# Patient Record
Sex: Female | Born: 1985 | Race: White | Hispanic: No | Marital: Single | Smoking: Never smoker
Health system: Southern US, Community
[De-identification: ages and names within clinical notes are randomized; demographics above are authoritative.]

---

## 2011-09-18 ENCOUNTER — Other Ambulatory Visit (HOSPITAL_COMMUNITY)
Admission: RE | Admit: 2011-09-18 | Discharge: 2011-09-18 | Disposition: A | Discharge status: Home / Self Care | Payer: BC Managed Care – PPO | Source: Ambulatory Visit | Attending: Obstetrics and Gynecology | Admitting: Obstetrics and Gynecology

## 2011-09-18 DIAGNOSIS — Z01419 Encounter for gynecological examination (general) (routine) without abnormal findings: Secondary | ICD-10-CM | POA: Insufficient documentation

## 2012-02-29 ENCOUNTER — Other Ambulatory Visit: Payer: Self-pay | Admitting: Obstetrics

## 2018-08-30 ENCOUNTER — Other Ambulatory Visit (HOSPITAL_COMMUNITY)
Admission: RE | Admit: 2018-08-30 | Discharge: 2018-08-30 | Disposition: A | Discharge status: Home / Self Care | Payer: BC Managed Care – PPO | Source: Ambulatory Visit | Attending: Obstetrics and Gynecology | Admitting: Obstetrics and Gynecology

## 2018-08-30 ENCOUNTER — Other Ambulatory Visit: Payer: Self-pay | Admitting: Obstetrics and Gynecology

## 2018-08-30 DIAGNOSIS — Z01411 Encounter for gynecological examination (general) (routine) with abnormal findings: Secondary | ICD-10-CM | POA: Diagnosis not present

## 2018-09-02 LAB — CYTOLOGY - PAP
DIAGNOSIS: NEGATIVE
HPV: NOT DETECTED

## 2019-08-18 ENCOUNTER — Other Ambulatory Visit: Payer: Self-pay

## 2019-08-18 DIAGNOSIS — Z20822 Contact with and (suspected) exposure to covid-19: Secondary | ICD-10-CM

## 2019-08-19 LAB — NOVEL CORONAVIRUS, NAA: SARS-CoV-2, NAA: NOT DETECTED

## 2020-01-11 ENCOUNTER — Encounter: Payer: BC Managed Care – PPO | Admitting: Adult Health

## 2021-03-01 ENCOUNTER — Other Ambulatory Visit: Payer: Self-pay

## 2021-03-01 ENCOUNTER — Emergency Department (HOSPITAL_COMMUNITY)
Admission: EM | Admit: 2021-03-01 | Discharge: 2021-03-01 | Disposition: A | Discharge status: Home / Self Care | Payer: BC Managed Care – PPO | Attending: Emergency Medicine | Admitting: Emergency Medicine

## 2021-03-01 ENCOUNTER — Encounter (HOSPITAL_COMMUNITY): Payer: Self-pay

## 2021-03-01 ENCOUNTER — Emergency Department (HOSPITAL_COMMUNITY): Payer: BC Managed Care – PPO

## 2021-03-01 DIAGNOSIS — R42 Dizziness and giddiness: Secondary | ICD-10-CM | POA: Diagnosis not present

## 2021-03-01 DIAGNOSIS — R0602 Shortness of breath: Secondary | ICD-10-CM | POA: Diagnosis not present

## 2021-03-01 DIAGNOSIS — M79602 Pain in left arm: Secondary | ICD-10-CM | POA: Diagnosis not present

## 2021-03-01 LAB — TROPONIN I (HIGH SENSITIVITY): Troponin I (High Sensitivity): <2 ng/L (ref <18)

## 2021-03-01 LAB — BASIC METABOLIC PANEL
Anion gap: 10 (ref 5–15)
BUN: 13 mg/dL (ref 6–20)
CO2: 26 mmol/L (ref 22–32)
Calcium: 9 mg/dL (ref 8.9–10.3)
Chloride: 102 mmol/L (ref 98–111)
Creatinine, Ser: 0.79 mg/dL (ref 0.44–1.00)
GFR, Estimated: >60 mL/min (ref >60)
Glucose, Bld: 100 mg/dL — ABNORMAL HIGH (ref 70–99)
Potassium: 3.9 mmol/L (ref 3.5–5.1)
Sodium: 138 mmol/L (ref 135–145)

## 2021-03-01 LAB — I-STAT BETA HCG BLOOD, ED (MC, WL, AP ONLY): I-stat hCG, quantitative: <5 m[IU]/mL (ref <5)

## 2021-03-01 LAB — CBC
HCT: 43 % (ref 36.0–46.0)
Hemoglobin: 14.5 g/dL (ref 12.0–15.0)
MCH: 29.2 pg (ref 26.0–34.0)
MCHC: 33.7 g/dL (ref 30.0–36.0)
MCV: 86.7 fL (ref 80.0–100.0)
Platelets: 312 10*3/uL (ref 150–400)
RBC: 4.96 MIL/uL (ref 3.87–5.11)
RDW: 12.9 % (ref 11.5–15.5)
WBC: 9.3 10*3/uL (ref 4.0–10.5)
nRBC: 0 % (ref 0.0–0.2)

## 2021-03-01 NOTE — ED Triage Notes (Signed)
Patient reports L arm pain that woke her up out of her sleep, then sudden urge to need to vomit, states she was sitting in the bathroom and had cold sweats and felt like she couldn't move.

## 2021-03-01 NOTE — Discharge Instructions (Signed)
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  Alternate ibuprofen or acetaminophen, ice and rest as needed  Your cardiac work up was normal today.  I don't think you had a cardiac primary problem.    Bug spray poisoning can cause breathing symptoms as well as light headedness BUT this is usually from DEET.  The product you used contains neem oil which is not typically seen to cause poisoning.  It is possible that the odor may have affected you.  Also, sudden standing can cause drop in blood pressure that can lead to light headedness, sweats, nausea, passing out  Monitor your symptoms, return for chest pain, shortness of breath, cough, bloody phlegm, light headedness or passing out

## 2021-03-01 NOTE — ED Provider Notes (Signed)
[facility] EMERGENCY DEPARTMENT Provider Note   CSN: [id number] Arrival date & time: 03/01/21  0419     History Chief Complaint  Patient presents with  . Arm Pain  . Nausea    Michele Delgado is a 35 y.o. female presents to the ED for evaluation of sudden onset left-sided arm pain that woke her up from her sleep at 4 AM this morning.  Pain was throbbing and located on the elbow.  She had a little bit of tingling as well.  She quickly sat up and stood up at the side of her bed and developed lightheadedness, she broke out in a cold sweat, nausea.  She felt like she was going to pass out.  She sat down and felt a little bit better.  States her "lungs hurt" and felt like it was hard to breathe.  Symptoms continued so she drove herself to the emergency department.  States her symptoms have completely resolved 30 minutes prior to being roomed.  States that yesterday she did a lot of yard work with a machine that she had to use her left hand to stabilize and move it.  States that her left arm is still sore to the touch and thinks this is related to the yardwork she did yesterday.  Also states that she sprayed prove bedbug spray at 5:30 PM yesterday all over her house.  She returned to her house at around 11 PM and she still noticed a very strong odor.  She opened her windows and went to bed.  She thinks the odor may have caused her symptoms as well because she now feels "completely fine".  She is not particularly concerned about bedbugs but states her friend told her she had bedbugs when she came back from a vacation.  Patient just came back from a trip to [facility] yesterday and was worried about the bedbugs so that she sprayed her house.  She never had chest pain, vomiting, headache.  Her breathing feels fine now.  She has a carbon monoxide monitor that is working well.  No longer having tingling in the left arm, no numbness, neck pain.  She is right-hand dominant.  She did a lot of yard  work yesterday but states she drank a lot of water.  No recreational drug use or alcohol use.  HPI     No past medical history on file.  There are no problems to display for this patient.   **The histories are not reviewed yet. Please review them in the "History" navigator section and refresh this SmartLink.   OB History   No obstetric history on file.     No family history on file.  Social History   Tobacco Use  . Smoking status: Never Smoker  . Smokeless tobacco: Never Used  Substance Use Topics  . Alcohol use: Yes  . Drug use: Never    Home Medications Prior to Admission medications   Medication Sig Start Date End Date Taking? Authorizing Provider  fluconazole (DIFLUCAN) 150 MG tablet Take 150 mg by mouth daily as needed (yeast infection). 09/01/20  Yes [provider]  LORazepam (ATIVAN) 0.5 MG tablet Take 0.5 mg by mouth at bedtime as needed for sleep. 09/11/20  Yes [provider]  spironolactone (ALDACTONE) 50 MG tablet Take 50 mg by mouth daily. 12/19/20  Yes [provider]  TRI-ESTARYLLA 0.18/0.215/0.25 MG-35 MCG tablet Take 1 tablet by mouth daily. 02/27/21  Yes [provider]    Allergies  Kiwi extract  Review of Systems   Review of Systems  Respiratory: Positive for shortness of breath.   Gastrointestinal: Positive for nausea.  Musculoskeletal: Positive for myalgias (left arm).  Neurological: Positive for light-headedness.       Left arm tingling   All other systems reviewed and are negative.   Physical Exam Updated Vital Signs BP 107/82   Pulse 86   Temp 98 F (36.7 C) (Oral)   Resp 16   Ht 5\' 5"  (1.651 m)   Wt 71.7 kg   LMP 02/16/2021   SpO2 99%   BMI 26.29 kg/m   Physical Exam Constitutional:      Appearance: She is well-developed.     Comments: NAD. Non toxic.   HENT:     Head: Normocephalic and atraumatic.     Nose: Nose normal.  Eyes:     General: Lids are normal.     Conjunctiva/sclera:  Conjunctivae normal.  Neck:     Trachea: Trachea normal.     Comments: Trachea midline.  Cardiovascular:     Rate and Rhythm: Normal rate and regular rhythm.     Pulses:          Radial pulses are 1+ on the right side and 1+ on the left side.       Dorsalis pedis pulses are 1+ on the right side and 1+ on the left side.     Heart sounds: Normal heart sounds, S1 normal and S2 normal.     Comments: No murmurs. No LE edema or calf tenderness.  Pulmonary:     Effort: Pulmonary effort is normal.     Breath sounds: Normal breath sounds.  Abdominal:     General: Bowel sounds are normal.     Palpations: Abdomen is soft.     Tenderness: There is no abdominal tenderness.     Comments: No epigastric or upper abdominal tenderness.  Musculoskeletal:        General: Tenderness present.     Cervical back: Normal range of motion.     Comments: Left upper extremity: mild TTP around proximal forearm, AC fossa and distal bicep.  Skin normal. Compartments soft   Skin:    General: Skin is warm and dry.     Capillary Refill: Capillary refill takes less than 2 seconds.     Comments: No rash to chest wall  Neurological:     Mental Status: She is alert.     GCS: GCS eye subscore is 4. GCS verbal subscore is 5. GCS motor subscore is 6.     Comments: Sensation and strength intact in upper/lower extremities  Psychiatric:        Speech: Speech normal.        Behavior: Behavior normal.        Thought Content: Thought content normal.     ED Results / Procedures / Treatments   Labs (all labs ordered are listed, but only abnormal results are displayed) Labs Reviewed  BASIC METABOLIC PANEL - Abnormal; Notable for the following components:      Result Value   Glucose, Bld 100 (*)    All other components within normal limits  CBC  I-STAT BETA HCG BLOOD, ED ([facility], [facility], [facility] ONLY)  TROPONIN I (HIGH SENSITIVITY)  TROPONIN I (HIGH SENSITIVITY)    EKG None  Radiology DG Chest 2 View  Result Date:  03/01/2021 CLINICAL DATA:  35 year old female with chest and arm pain which woke her from sleep. Nausea and diaphoresis.  EXAM: CHEST - 2 VIEW COMPARISON:  None. FINDINGS: Normal lung volumes and mediastinal contours. Visualized tracheal air column is within normal limits. Hair artifact at the right upper chest. No pneumothorax, pulmonary edema, pleural effusion or confluent pulmonary opacity. Mild dextroconvex lower thoracic scoliosis. No acute osseous abnormality identified. Normal visible bowel gas pattern. IMPRESSION: Negative.  No acute cardiopulmonary abnormality. Electronically Signed   By: Odessa Fleming M.D.   On: 03/01/2021 04:55    Procedures Procedures   Medications Ordered in ED Medications - No data to display  ED Course  I have reviewed the triage vital signs and the nursing notes.  Pertinent labs & imaging results that were available during my care of the patient were reviewed by me and considered in my medical decision making (see chart for details).    MDM Rules/Calculators/A&P                          35 yo F presents for left arm pain and light headedness, diaphoresis, shortness of breath upon sudden standing as well as arm pain at 0400 today.    EMR triage and nursing notes reviewed to obtain more history.  Saw [facility] provider who ordered cardiac work up.   Labs and imaging reviewed and interpreted by me.   Labs reveal - vastly reassuring. Normal WBC, hgb.  Negative Hgb.  Trop x 1 undetectable.  Patient had no chest pain whatsoever. Given this, age, history considered ACS unlikely.  I do not think repeat trop is necessary.   Imaging reveals - EKG and CXR without acute abnormalities  ED course 1050: By the time I evaluated patient she was asymptomatic. Only had mild reproducible left arm pain.  Appropriate for discharge   MDM Suspect MSK etiology of arm pain. She did yardwork yesterday and has reproducible left arm tenderness with normal distal neuro/pulse exam. No evidence  of skin or joint infection, arm edema. Doubt DVT, infection. No neck pain. No chest pain. Strong suspicion for MSK etiology of arm pain. I don't think this is related to her sudden onset of light headedness, diaphoresis, shortness of breath upon standing.  She used proof bug spray in her home and slept through the night states the odor was still very strong.  Wonder if this contributed to her symptoms.  I looked up proof spray and main ingredient is neem oil.  No deet. She has working carbon monoxide monitor at home and denies classic symptoms of CO poisoning.  Considering orthostasis.  Positional/orthostatic symptoms that are unlikely to be related to arm pain.  Doubt significant poisoning from inhaling neem oil.  Will discharge with NSAID for arm pain, discussed monitoring parameters and symptoms that would warrant return to ER. Patient in agreement and comfortable with POC and discharge.  Final Clinical Impression(s) / ED Diagnoses Final diagnoses:  Lightheadedness  Left arm pain  Shortness of breath    Rx / DC Orders ED Discharge Orders    None       Jerrell Mylar 03/01/21 1055    Gerhard Munch, MD 03/01/21 1246

## 2022-10-11 IMAGING — CR DG CHEST 2V
2 series · 2 of 2 positions shown · non-contrast
Comparison: None.

CLINICAL DATA: 34-year-old female with chest and arm pain which
woke her from sleep. Nausea and diaphoresis.

EXAM:
CHEST - 2 VIEW

[chest pa]
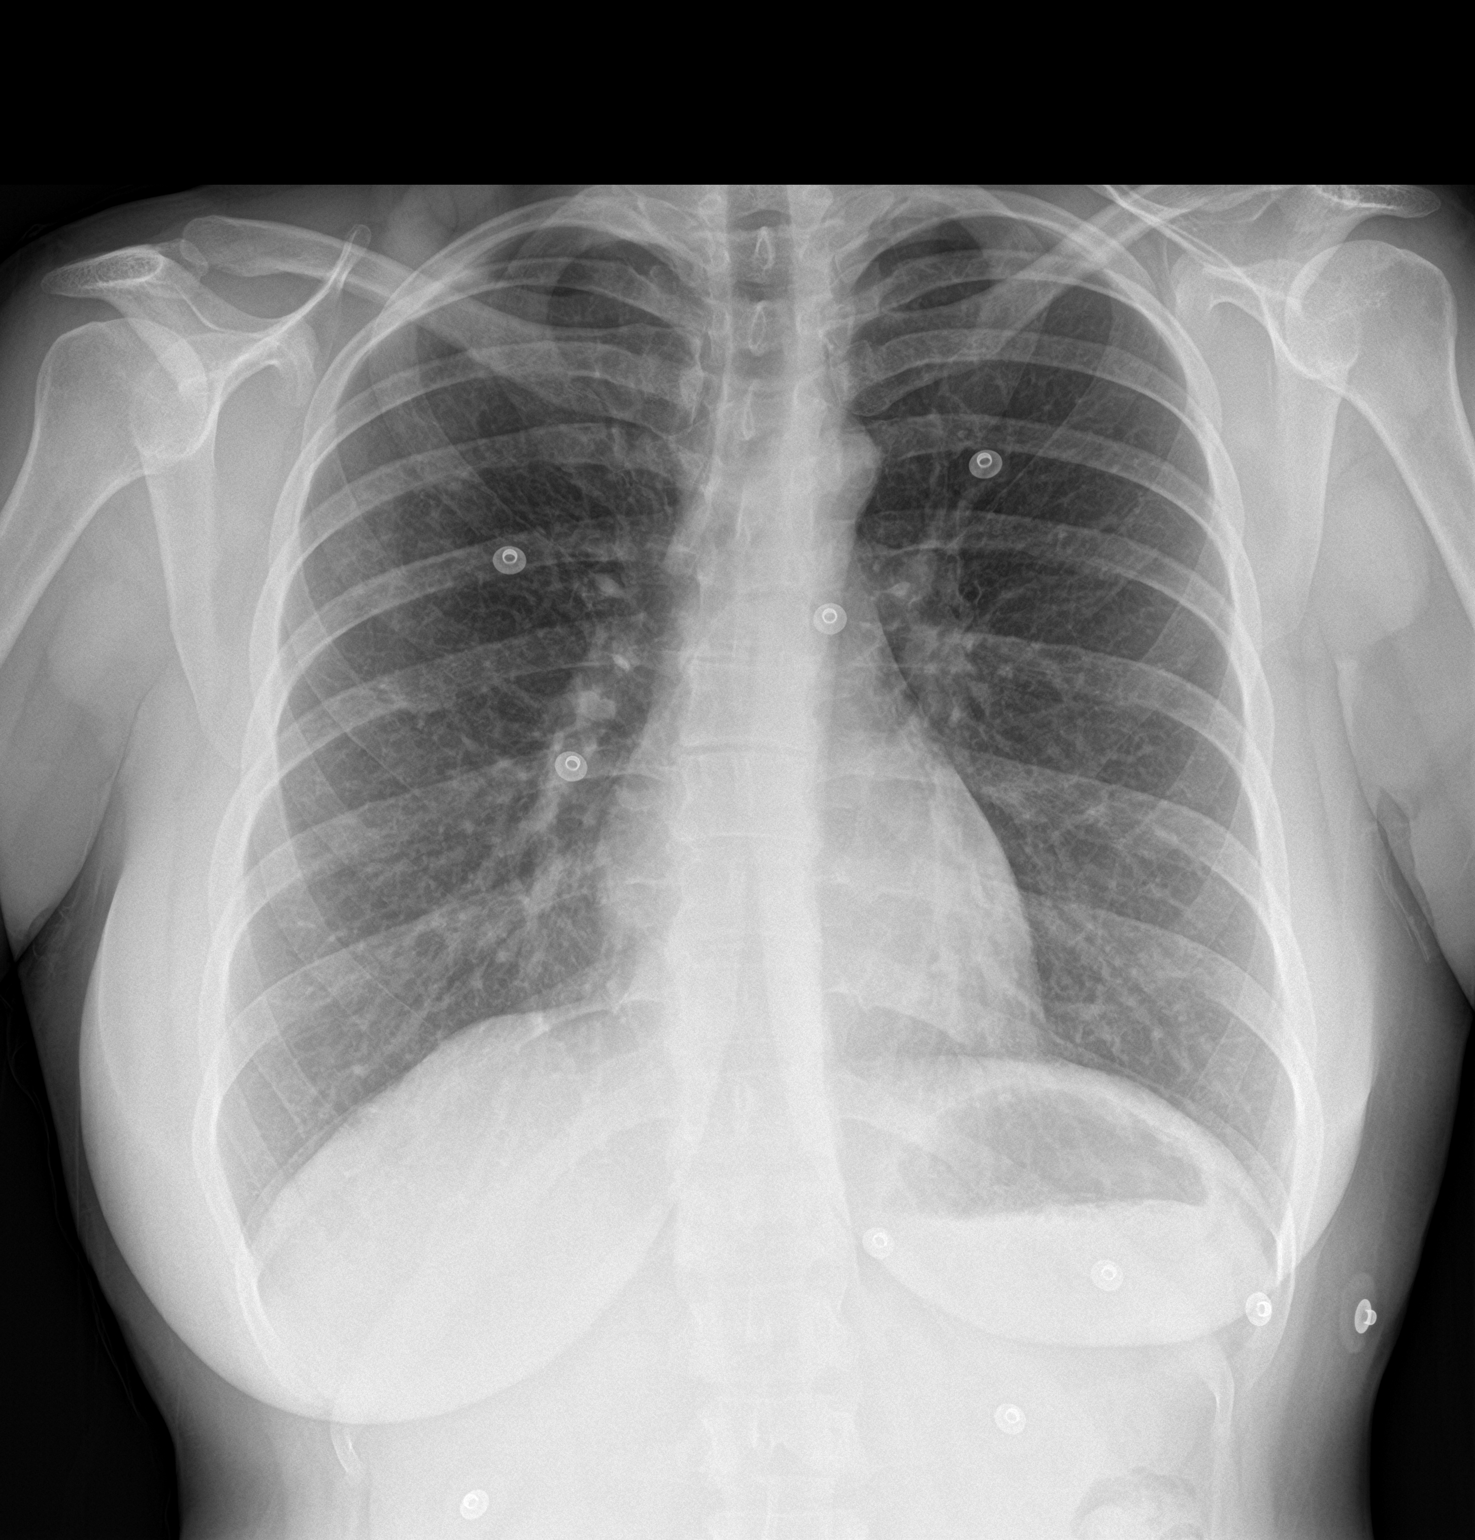

[chest lat]
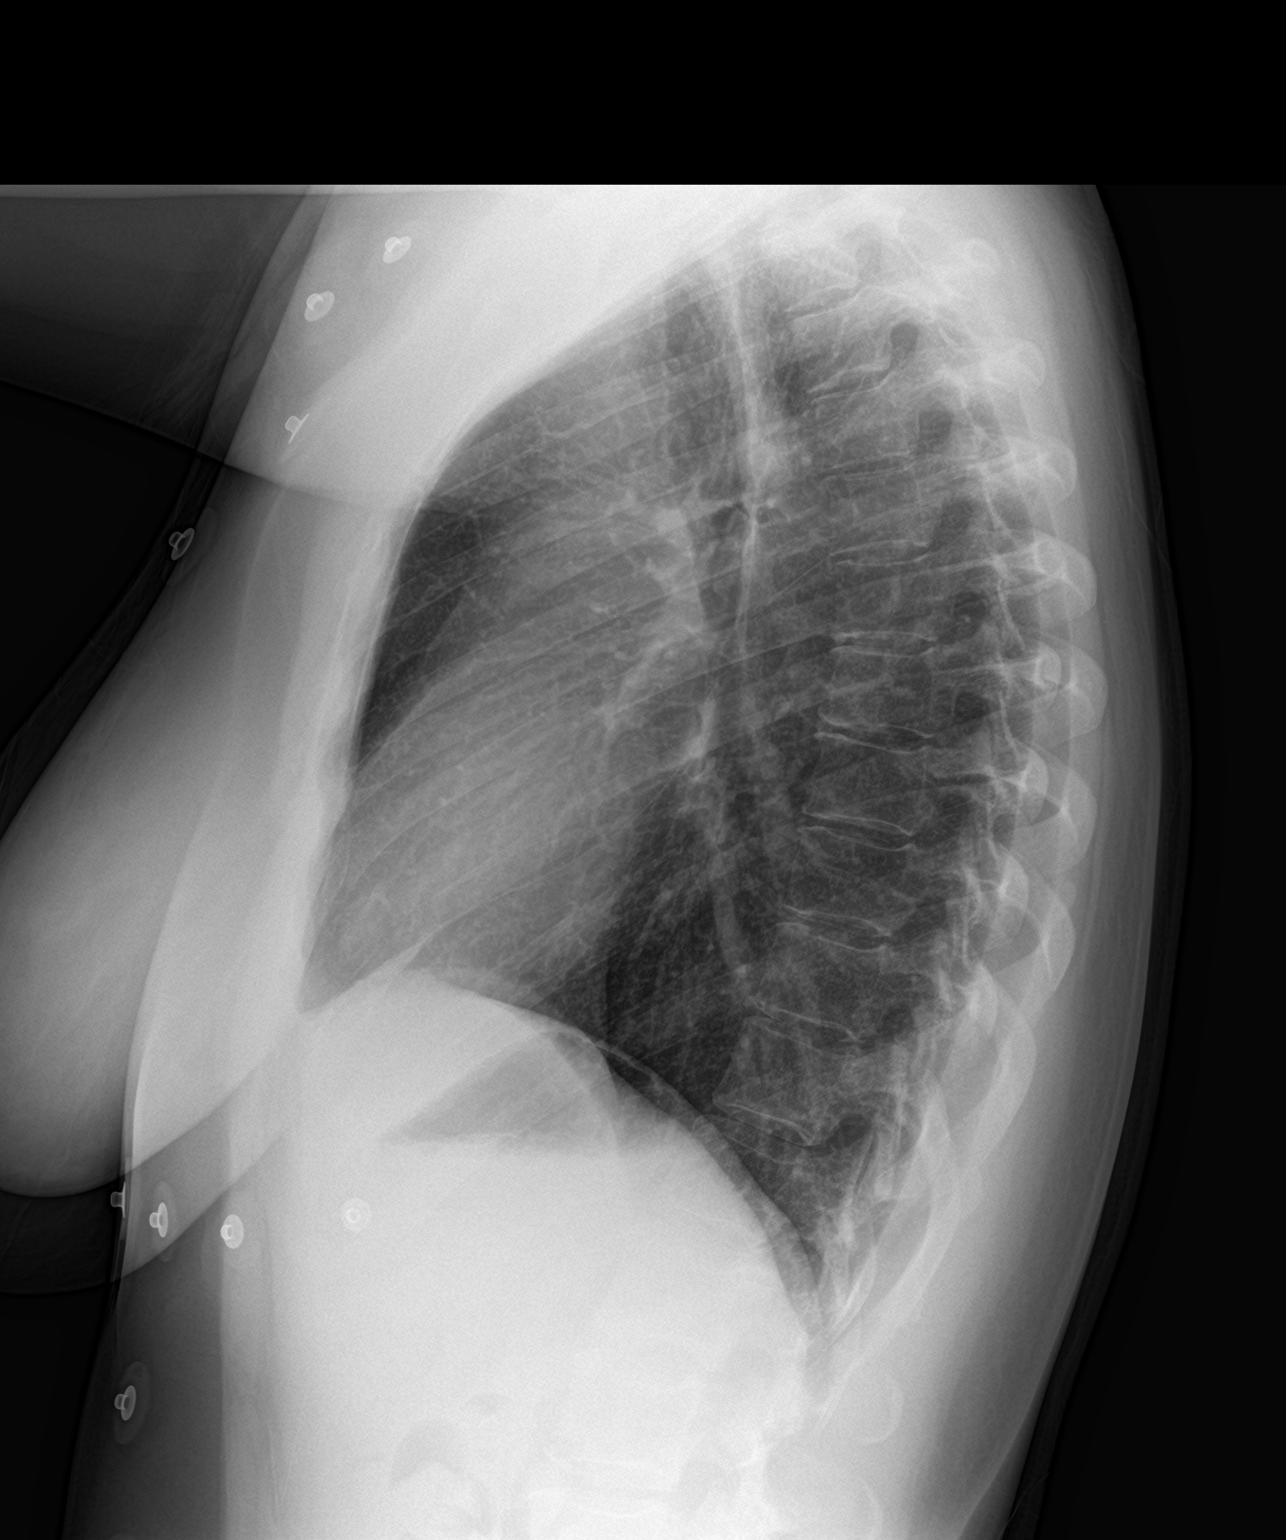

[2 of 2 positions shown; findings below may reference images not displayed]

FINDINGS: Normal lung volumes and mediastinal contours. Visualized tracheal
air column is within normal limits.

Hair artifact at the right upper chest. No pneumothorax, pulmonary
edema, pleural effusion or confluent pulmonary opacity.

Mild dextroconvex lower thoracic scoliosis. No acute osseous
abnormality identified. Normal visible bowel gas pattern.
IMPRESSION: Negative.  No acute cardiopulmonary abnormality.

## 2023-01-28 ENCOUNTER — Other Ambulatory Visit (HOSPITAL_COMMUNITY)
Admission: RE | Admit: 2023-01-28 | Discharge: 2023-01-28 | Disposition: A | Discharge status: Home / Self Care | Payer: BC Managed Care – PPO | Source: Ambulatory Visit | Attending: Nurse Practitioner | Admitting: Nurse Practitioner

## 2023-01-28 ENCOUNTER — Other Ambulatory Visit: Payer: Self-pay | Admitting: Nurse Practitioner

## 2023-01-28 DIAGNOSIS — Z124 Encounter for screening for malignant neoplasm of cervix: Secondary | ICD-10-CM | POA: Insufficient documentation

## 2023-02-03 LAB — CYTOLOGY - PAP
Comment: NEGATIVE
Diagnosis: NEGATIVE
High risk HPV: NEGATIVE

## 2023-04-28 ENCOUNTER — Other Ambulatory Visit: Payer: Self-pay | Admitting: Family Medicine

## 2023-04-28 DIAGNOSIS — N644 Mastodynia: Secondary | ICD-10-CM

## 2023-05-17 ENCOUNTER — Ambulatory Visit
Admission: RE | Admit: 2023-05-17 | Discharge: 2023-05-17 | Disposition: A | Discharge status: Home / Self Care | Payer: BC Managed Care – PPO | Source: Ambulatory Visit | Attending: Family Medicine | Admitting: Family Medicine

## 2023-05-17 ENCOUNTER — Other Ambulatory Visit: Payer: Self-pay | Admitting: Family Medicine

## 2023-05-17 ENCOUNTER — Ambulatory Visit: Admission: RE | Admit: 2023-05-17 | Payer: BC Managed Care – PPO | Source: Ambulatory Visit

## 2023-05-17 DIAGNOSIS — N644 Mastodynia: Secondary | ICD-10-CM

## 2023-05-17 DIAGNOSIS — N6320 Unspecified lump in the left breast, unspecified quadrant: Secondary | ICD-10-CM

## 2023-11-15 ENCOUNTER — Encounter (HOSPITAL_COMMUNITY): Payer: Self-pay

## 2023-11-15 ENCOUNTER — Emergency Department (HOSPITAL_COMMUNITY)
Admission: EM | Admit: 2023-11-15 | Discharge: 2023-11-15 | Disposition: A | Discharge status: Home / Self Care | Payer: 59 | Attending: Emergency Medicine | Admitting: Emergency Medicine

## 2023-11-15 ENCOUNTER — Other Ambulatory Visit: Payer: Self-pay

## 2023-11-15 DIAGNOSIS — K921 Melena: Secondary | ICD-10-CM | POA: Diagnosis present

## 2023-11-15 DIAGNOSIS — K529 Noninfective gastroenteritis and colitis, unspecified: Secondary | ICD-10-CM | POA: Diagnosis not present

## 2023-11-15 LAB — CBC
HCT: 44 % (ref 36.0–46.0)
Hemoglobin: 14.5 g/dL (ref 12.0–15.0)
MCH: 28.9 pg (ref 26.0–34.0)
MCHC: 33 g/dL (ref 30.0–36.0)
MCV: 87.6 fL (ref 80.0–100.0)
Platelets: 292 10*3/uL (ref 150–400)
RBC: 5.02 MIL/uL (ref 3.87–5.11)
RDW: 12.8 % (ref 11.5–15.5)
WBC: 8.7 10*3/uL (ref 4.0–10.5)
nRBC: 0 % (ref 0.0–0.2)

## 2023-11-15 LAB — COMPREHENSIVE METABOLIC PANEL
ALT: 10 U/L (ref 0–44)
AST: 16 U/L (ref 15–41)
Albumin: 3.6 g/dL (ref 3.5–5.0)
Alkaline Phosphatase: 54 U/L (ref 38–126)
Anion gap: 9 (ref 5–15)
BUN: 5 mg/dL — ABNORMAL LOW (ref 6–20)
CO2: 25 mmol/L (ref 22–32)
Calcium: 9.1 mg/dL (ref 8.9–10.3)
Chloride: 105 mmol/L (ref 98–111)
Creatinine, Ser: 0.87 mg/dL (ref 0.44–1.00)
GFR, Estimated: >60 mL/min (ref >60)
Glucose, Bld: 83 mg/dL (ref 70–99)
Potassium: 4.1 mmol/L (ref 3.5–5.1)
Sodium: 139 mmol/L (ref 135–145)
Total Bilirubin: 0.5 mg/dL (ref 0.0–1.2)
Total Protein: 6.8 g/dL (ref 6.5–8.1)

## 2023-11-15 LAB — HCG, SERUM, QUALITATIVE: Preg, Serum: NEGATIVE

## 2023-11-15 NOTE — ED Provider Notes (Signed)
[place] EMERGENCY DEPARTMENT AT [facility] Provider Note   CSN: [id number] Arrival date & time: 11/15/23  1122     History  Chief Complaint  Patient presents with   Blood In Stools    Michele Delgado is a 38 y.o. female.  Patient presents for assessment due to dark stools worsening the last 2 days.  Patient had vomiting diarrhea that started about 4 hours after eating at a restaurant Friday.  Patient's symptoms and signs of improved significantly today however still not a great appetite.  Patient has mild cramping lower mid and left lower area.  No history of diverticulitis.  No fevers.  No blood thinner use.  Patient did take Pepto-Bismol during these episodes.  The history is provided by the patient.       Home Medications Prior to Admission medications   Medication Sig Start Date End Date Taking? Authorizing Provider  fluconazole (DIFLUCAN) 150 MG tablet Take 150 mg by mouth daily as needed (yeast infection). 09/01/20   [provider]  LORazepam (ATIVAN) 0.5 MG tablet Take 0.5 mg by mouth at bedtime as needed for sleep. 09/11/20   [provider]  spironolactone (ALDACTONE) 50 MG tablet Take 50 mg by mouth daily. 12/19/20   [provider]  TRI-ESTARYLLA 0.18/0.215/0.25 MG-35 MCG tablet Take 1 tablet by mouth daily. 02/27/21   [provider]      Allergies    Kiwi extract    Review of Systems   Review of Systems  Constitutional:  Negative for chills and fever.  HENT:  Negative for congestion.   Eyes:  Negative for visual disturbance.  Respiratory:  Negative for shortness of breath.   Cardiovascular:  Negative for chest pain.  Gastrointestinal:  Positive for abdominal pain, blood in stool, diarrhea, nausea and vomiting.  Genitourinary:  Negative for dysuria and flank pain.  Musculoskeletal:  Negative for back pain, neck pain and neck stiffness.  Skin:  Negative for rash.  Neurological:  Negative for light-headedness and  headaches.    Physical Exam Updated Vital Signs BP 105/75 (BP Location: Left Arm)   Pulse 79   Temp 97.9 F (36.6 C)   Resp 14   Ht 5\' 5"  (1.651 m)   Wt 71.7 kg   SpO2 100%   BMI 26.30 kg/m  Physical Exam Vitals and nursing note reviewed.  Constitutional:      General: She is not in acute distress.    Appearance: She is well-developed.  HENT:     Head: Normocephalic and atraumatic.     Mouth/Throat:     Mouth: Mucous membranes are moist.  Eyes:     General:        Right eye: No discharge.        Left eye: No discharge.     Conjunctiva/sclera: Conjunctivae normal.  Neck:     Trachea: No tracheal deviation.  Cardiovascular:     Rate and Rhythm: Normal rate and regular rhythm.     Heart sounds: No murmur heard. Pulmonary:     Effort: Pulmonary effort is normal.     Breath sounds: Normal breath sounds.  Abdominal:     General: There is no distension.     Palpations: Abdomen is soft.     Tenderness: There is abdominal tenderness (minimal LLQ). There is no guarding.  Musculoskeletal:     Cervical back: Normal range of motion and neck supple. No rigidity.  Skin:    General: Skin is warm.  Capillary Refill: Capillary refill takes less than 2 seconds.     Findings: No rash.  Neurological:     General: No focal deficit present.     Mental Status: She is alert.     Cranial Nerves: No cranial nerve deficit.  Psychiatric:        Mood and Affect: Mood normal.     ED Results / Procedures / Treatments   Labs (all labs ordered are listed, but only abnormal results are displayed) Labs Reviewed  COMPREHENSIVE METABOLIC PANEL - Abnormal; Notable for the following components:      Result Value   BUN 5 (*)    All other components within normal limits  CBC  HCG, SERUM, QUALITATIVE  POC OCCULT BLOOD, ED    EKG None  Radiology No results found.  Procedures Procedures    Medications Ordered in ED Medications - No data to display  ED Course/ Medical Decision  Making/ A&P                                 Medical Decision Making Amount and/or Complexity of Data Reviewed Labs: ordered.   Healthy patient without any history of ulcers/GI concerns presents with clinical concern for dark stools secondary to inflammation or Pepto-Bismol.  Patient has no clinical signs of anemia, normal heart rate, well-appearing, skin not pale and hemoglobin normal 14.5 reviewed.  No signs of significant dehydration warrant IV fluids given normal electrolytes and kidney function.  Very low concern for diverticulitis, bleeding ulcer or colitis given history and presentation.  Discussed supportive care and reasons to return.  Patient comfortable with plan.        Final Clinical Impression(s) / ED Diagnoses Final diagnoses:  Gastroenteritis  Blood in stool    Rx / DC Orders ED Discharge Orders     None         Blane Ohara, MD 11/15/23 [phone]

## 2023-11-15 NOTE — ED Provider Triage Note (Signed)
Emergency Medicine Provider Triage Evaluation Note  Jamoni Broadfoot , a 38 y.o. female  was evaluated in triage.  Pt complains of dark and bloody stools x 3 days. On Friday night got food poisoning, with vomiting and diarrhea. Was taking pepto bismol, immodium. Had BM this morning, still bloody. Sometimes bright red blood, other times maroon color. No longer having diarrhea.   Not on blood thinners  Review of Systems  Positive: Blood in stools, abdominal cramping, fatigue Negative: Lightheadedness, syncope  Physical Exam  BP 105/75 (BP Location: Left Arm)   Pulse 79   Temp 97.9 F (36.6 C)   Resp 14   Ht 5\' 5"  (1.651 m)   Wt 71.7 kg   SpO2 100%   BMI 26.30 kg/m  Gen:   Awake, no distress   Resp:  Normal effort  MSK:   Moves extremities without difficulty  Other:    Medical Decision Making  Medically screening exam initiated at 12:53 PM.  Appropriate orders placed.  Jannie Doyle was informed that the remainder of the evaluation will be completed by another provider, this initial triage assessment does not replace that evaluation, and the importance of remaining in the ED until their evaluation is complete.  Workup initiated   Krystyl Cannell T, PA-C 11/15/23 1255

## 2023-11-15 NOTE — Discharge Instructions (Signed)
Work note provided.  Continue good hand hygiene. Use Tylenol every 4 hours as needed for pains or fevers. For worsening and persistent pain, persistent fevers or concern for significant dehydration see a clinician or return to the emergency room.

## 2023-11-15 NOTE — ED Triage Notes (Signed)
Pt states she had food poisoning Friday night and has been having dark blood stools and RLQ, LLQ abdominal cramping ever since. Pt denies weakness or SOB.
